# Patient Record
Sex: Male | Born: 1990 | Race: Black or African American | Hispanic: No | Marital: Single | Smoking: Never smoker
Health system: Southern US, Community
[De-identification: ages and names within clinical notes are randomized; demographics above are authoritative.]

## PROBLEM LIST (undated history)

## (undated) DIAGNOSIS — T7840XA Allergy, unspecified, initial encounter: Secondary | ICD-10-CM

## (undated) HISTORY — DX: Allergy, unspecified, initial encounter: T78.40XA

---

## 2010-12-30 ENCOUNTER — Emergency Department (HOSPITAL_COMMUNITY): Payer: Managed Care, Other (non HMO)

## 2010-12-30 ENCOUNTER — Emergency Department (HOSPITAL_COMMUNITY)
Admission: EM | Admit: 2010-12-30 | Discharge: 2010-12-31 | Disposition: A | Discharge status: Home / Self Care | Payer: Managed Care, Other (non HMO) | Attending: Emergency Medicine | Admitting: Emergency Medicine

## 2010-12-30 DIAGNOSIS — S01501A Unspecified open wound of lip, initial encounter: Secondary | ICD-10-CM | POA: Insufficient documentation

## 2010-12-30 DIAGNOSIS — IMO0002 Reserved for concepts with insufficient information to code with codable children: Secondary | ICD-10-CM | POA: Insufficient documentation

## 2010-12-30 DIAGNOSIS — S025XXA Fracture of tooth (traumatic), initial encounter for closed fracture: Secondary | ICD-10-CM | POA: Insufficient documentation

## 2012-03-18 IMAGING — CT CT MAXILLOFACIAL W/O CM
3 series · 17 of 47 positions shown, 20 images · non-contrast
Comparison: None

CLINICAL DATA: Fall, facial injury

CT MAXILLOFACIAL WITHOUT CONTRAST
TECHNIQUE: Multidetector CT imaging of the maxillofacial
structures was performed. Multiplanar CT image reconstructions were
also generated.

[Series 2: facial bones · axial · 0.36mm/px · z∈[+15,+167]mm · 11 of 90 slices shown, 14 images]
[im 7/90  brain]
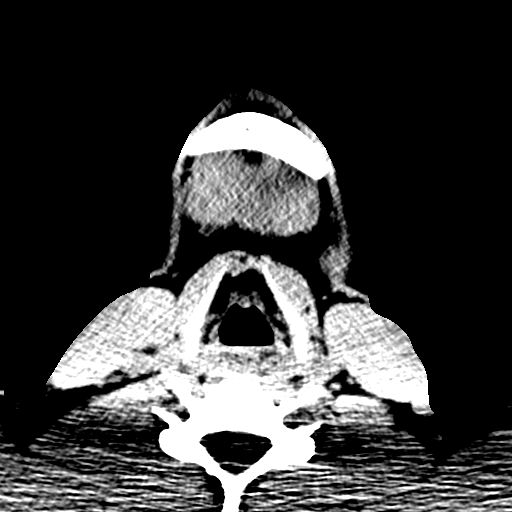
[im 7/90  bone]
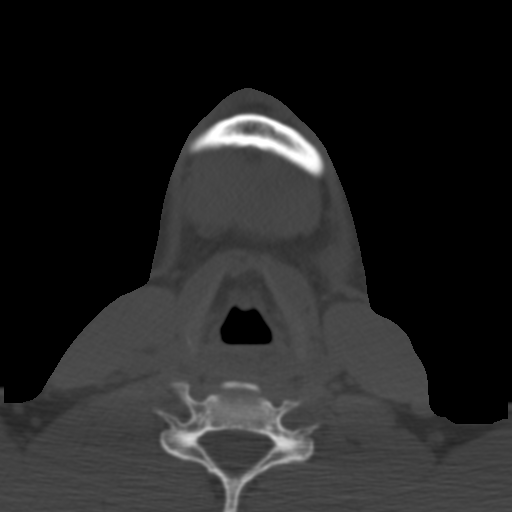
[im 13/90  bone]
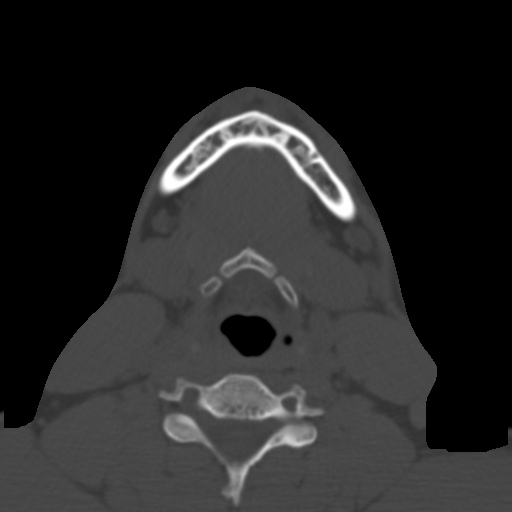
[im 22/90  bone]
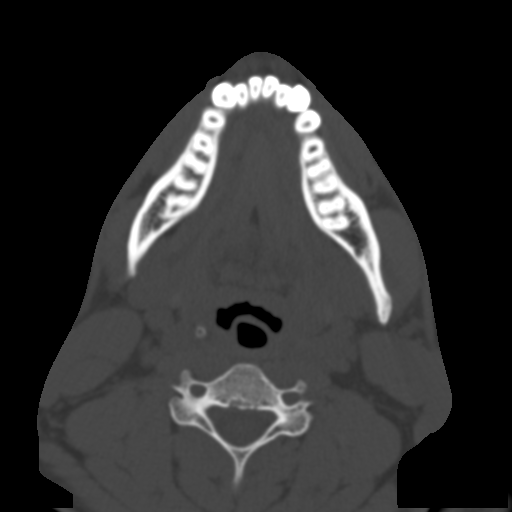
[im 28/90  bone]
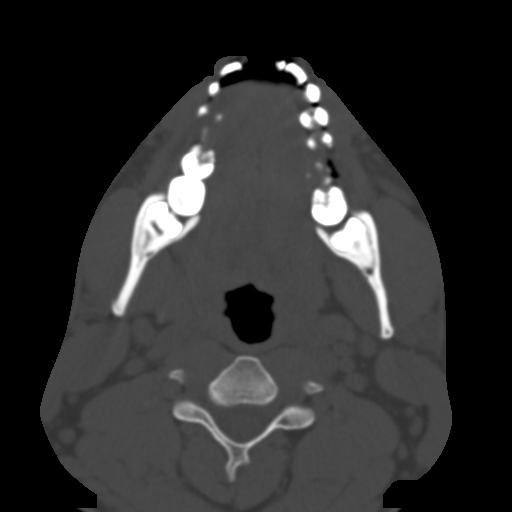
[im 37/90  brain]
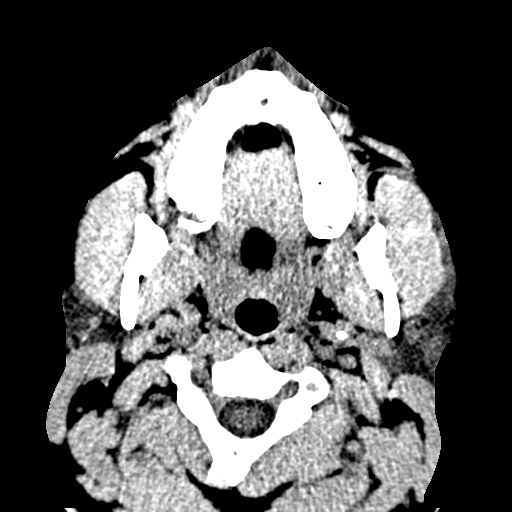
[im 37/90  bone]
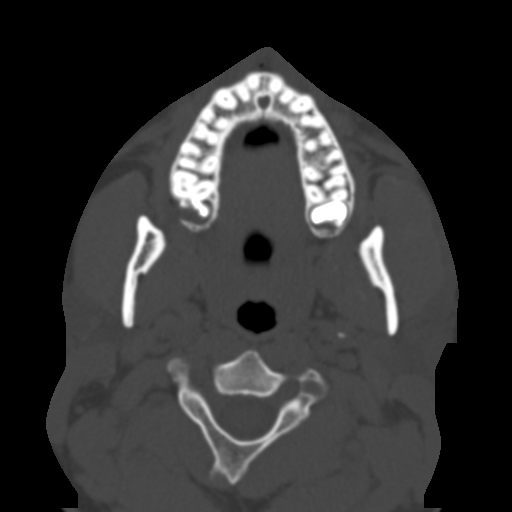
[im 47/90  bone]
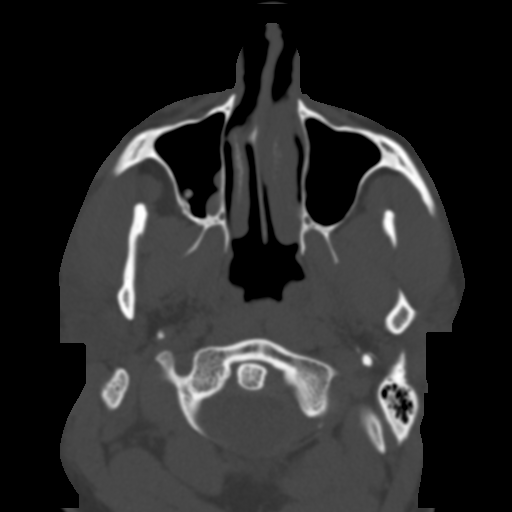
[im 53/90  bone]
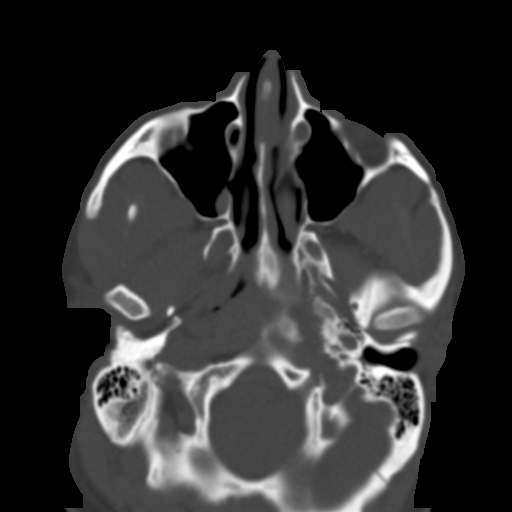
[im 62/90  bone]
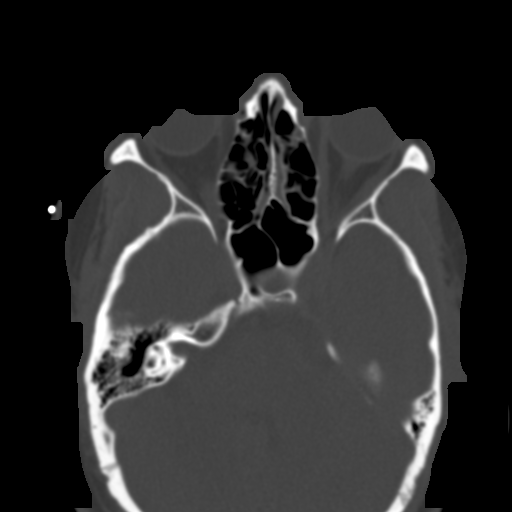
[im 68/90  brain]
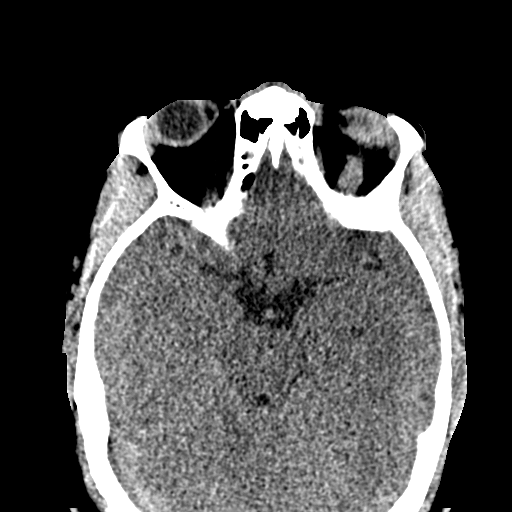
[im 68/90  bone]
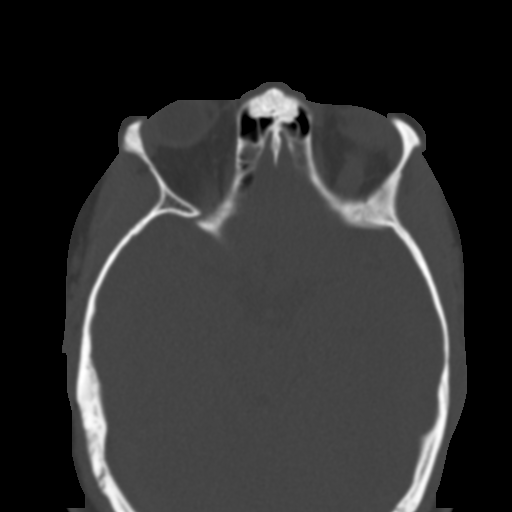
[im 77/90  bone]
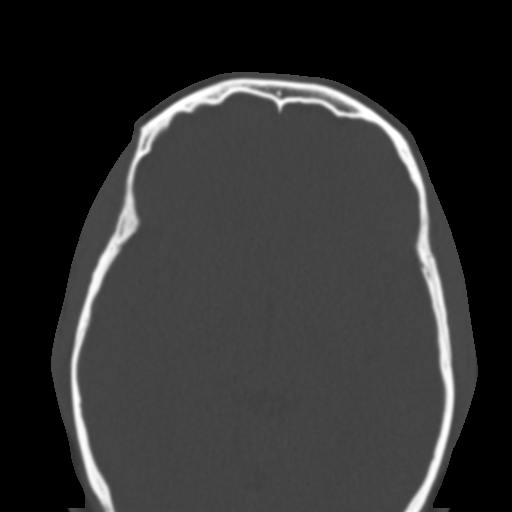
[im 83/90  bone]
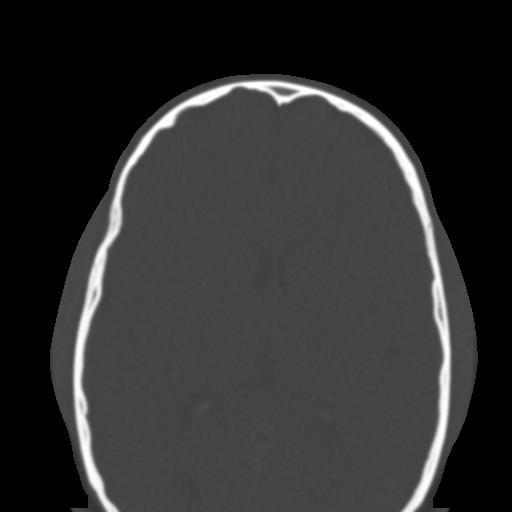

[cor · coronal · 0.36mm/px · 3 of 63 slices shown]
[im 21/63  bone]
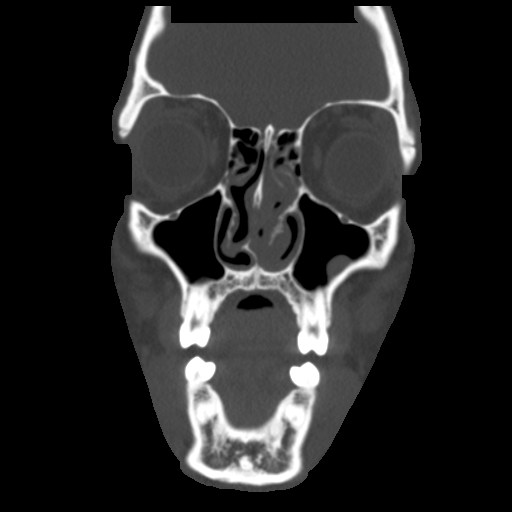
[im 28/63  bone]
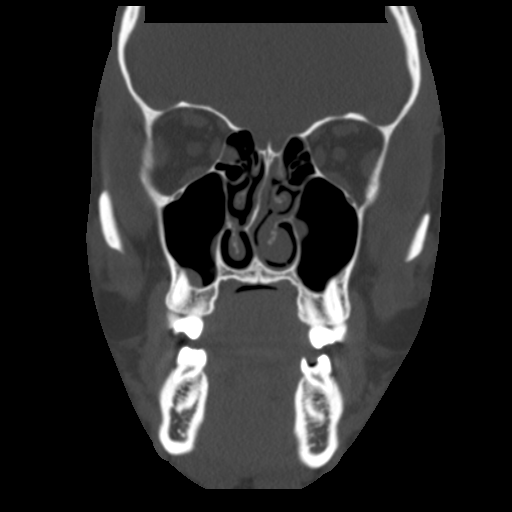
[im 35/63  bone]
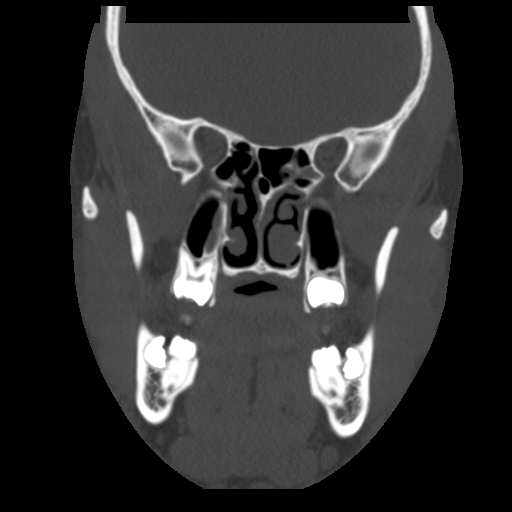

[sag · sagittal · 0.36mm/px · 3 of 71 slices shown]
[im 24/71  bone]
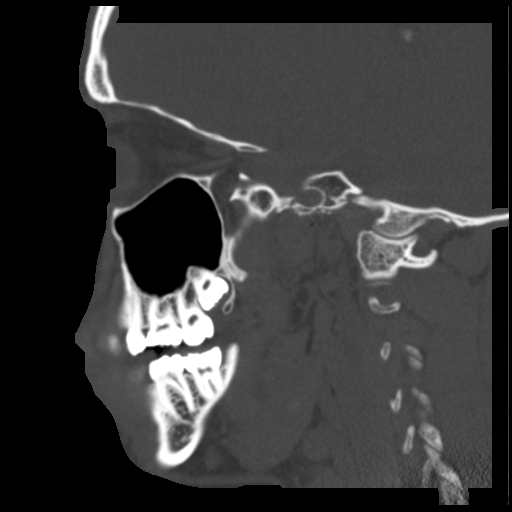
[im 36/71  bone]
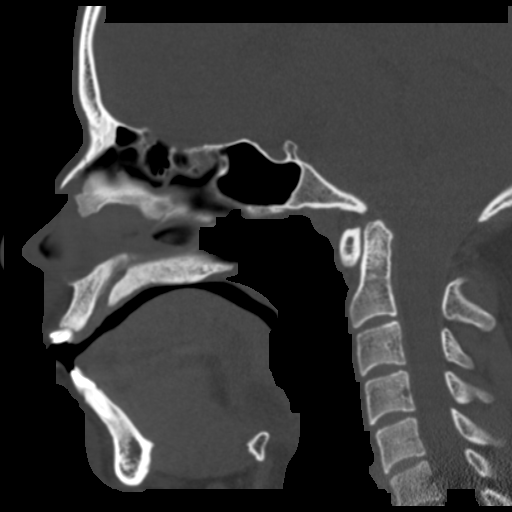
[im 47/71  bone]
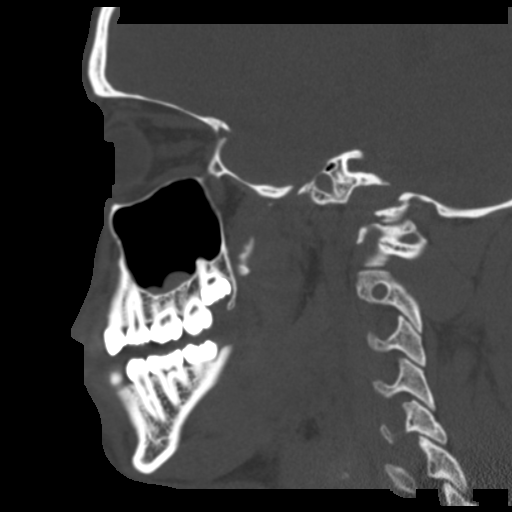

[17 of 47 positions shown; findings below may reference images not displayed]

FINDINGS: Negative for facial fracture.  Negative for mandible
fracture.  Mild chronic sinusitis.  No fluid in the sinuses.  No
mass or adenopathy is detected.
IMPRESSION: No acute abnormality.  Negative for facial fracture.

## 2013-12-03 ENCOUNTER — Ambulatory Visit (INDEPENDENT_AMBULATORY_CARE_PROVIDER_SITE_OTHER): Payer: Managed Care, Other (non HMO) | Admitting: Family Medicine

## 2013-12-03 VITALS — BP 116/74 | HR 67 | Temp 99.9°F | Resp 20 | Ht 69.5 in | Wt 193.0 lb

## 2013-12-03 DIAGNOSIS — J301 Allergic rhinitis due to pollen: Secondary | ICD-10-CM

## 2013-12-03 DIAGNOSIS — J309 Allergic rhinitis, unspecified: Secondary | ICD-10-CM

## 2013-12-03 DIAGNOSIS — J020 Streptococcal pharyngitis: Secondary | ICD-10-CM

## 2013-12-03 DIAGNOSIS — J029 Acute pharyngitis, unspecified: Secondary | ICD-10-CM

## 2013-12-03 LAB — POCT RAPID STREP A (OFFICE): RAPID STREP A SCREEN: POSITIVE — AB

## 2013-12-03 MED ORDER — AZELASTINE HCL 0.1 % NA SOLN
2.0000 | Freq: Two times a day (BID) | NASAL | Status: DC
Start: 2013-12-03 — End: 2013-12-14

## 2013-12-03 MED ORDER — AMOXICILLIN 500 MG PO TABS: 1000.0000 mg | ORAL_TABLET | Freq: Two times a day (BID) | ORAL | Status: DC

## 2013-12-03 MED ORDER — AMOXICILLIN 400 MG/5ML PO SUSR: 800.0000 mg | Freq: Three times a day (TID) | ORAL | Status: DC

## 2013-12-03 NOTE — Patient Instructions (Signed)
Strep Throat  Strep throat is an infection of the throat caused by a bacteria named Streptococcus pyogenes. Your caregiver may call the infection streptococcal "tonsillitis" or "pharyngitis" depending on whether there are signs of inflammation in the tonsils or back of the throat. Strep throat is most common in children aged 24 15 years during the cold months of the year, but it can occur in people of any age during any season. This infection is spread from person to person (contagious) through coughing, sneezing, or other close contact.  SYMPTOMS   · Fever or chills.  · Painful, swollen, red tonsils or throat.  · Pain or difficulty when swallowing.  · White or yellow spots on the tonsils or throat.  · Swollen, tender lymph nodes or "glands" of the neck or under the jaw.  · Red rash all over the body (rare).  DIAGNOSIS   Many different infections can cause the same symptoms. A test must be done to confirm the diagnosis so the right treatment can be given. A "rapid strep test" can help your caregiver make the diagnosis in a few minutes. If this test is not available, a light swab of the infected area can be used for a throat culture test. If a throat culture test is done, results are usually available in a day or two.  TREATMENT   Strep throat is treated with antibiotic medicine.  HOME CARE INSTRUCTIONS   · Gargle with 1 tsp of salt in 1 cup of warm water, 3 4 times per day or as needed for comfort.  · Family members who also have a sore throat or fever should be tested for strep throat and treated with antibiotics if they have the strep infection.  · Make sure everyone in your household washes their hands well.  · Do not share food, drinking cups, or personal items that could cause the infection to spread to others.  · You may need to eat a soft food diet until your sore throat gets better.  · Drink enough water and fluids to keep your urine clear or pale yellow. This will help prevent dehydration.  · Get plenty of  rest.  · Stay home from school, daycare, or work until you have been on antibiotics for 24 hours.  · Only take over-the-counter or prescription medicines for pain, discomfort, or fever as directed by your caregiver.  · If antibiotics are prescribed, take them as directed. Finish them even if you start to feel better.  SEEK MEDICAL CARE IF:   · The glands in your neck continue to enlarge.  · You develop a rash, cough, or earache.  · You cough up green, yellow-brown, or bloody sputum.  · You have pain or discomfort not controlled by medicines.  · Your problems seem to be getting worse rather than better.  SEEK IMMEDIATE MEDICAL CARE IF:   · You develop any new symptoms such as vomiting, severe headache, stiff or painful neck, chest pain, shortness of breath, or trouble swallowing.  · You develop severe throat pain, drooling, or changes in your voice.  · You develop swelling of the neck, or the skin on the neck becomes red and tender.  · You have a fever.  · You develop signs of dehydration, such as fatigue, dry mouth, and decreased urination.  · You become increasingly sleepy, or you cannot wake up completely.  Document Released: 06/29/2000 Document Revised: 06/18/2012 Document Reviewed: 08/31/2010  Exit[employer]® Patient Information ©2014 [employer].

## 2013-12-03 NOTE — Progress Notes (Addendum)
This chart was scribed for Ethelda ChickKristi M Ellanora Rayborn, MD by Tana ConchStephen Methvin, ED Scribe. This patient was seen in room 04 and the patient's care was started at 7:41 PM .  Subjective:    Patient ID: Anthony Curryyan O Lang, male    DOB: 1990/11/17, 23 y.o.   MRN: [id number]  HPI  HPI Comments: Anthony Lang is a 23 y.o. male with a h/o "bad allergies" who presents to [facility] complaining of a intermittent fever that has been present since Tuesday night. He reports associated wheezing, cough, ear pain, congestion, and sore throat. He denies vomiting, diarrhea, and nausea. He reports that NyQuil helps him sleep, but he does not take anything for his symptoms during the day. 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 He has allegra, but he does not take it everyday. He has not been around anyone with mono.   No PCP. [facility]  Past Medical History  Diagnosis Date  . Allergy     No current outpatient prescriptions on file prior to visit.   No current facility-administered medications on file prior to visit.    No Known Allergies   Review of Systems  Constitutional: Positive for fever and chills. Negative for activity change, appetite change and unexpected weight change.  HENT: Positive for congestion, ear pain, postnasal drip, rhinorrhea, sinus pressure and sore throat. Negative for tinnitus and trouble swallowing.   Eyes: Negative for pain, discharge, redness, itching and visual disturbance.  Respiratory: Positive for cough and wheezing. Negative for shortness of breath.   Cardiovascular: Negative for chest pain.  Allergic/Immunologic: Positive for environmental allergies.  Neurological: Negative for dizziness and headaches.       Objective:   Physical Exam  Nursing note and vitals reviewed. Constitutional: He is oriented to person, place, and time. He appears well-developed and well-nourished.  HENT:  Head: Normocephalic and atraumatic.  Mouth/Throat:  Oropharyngeal exudate and posterior oropharyngeal erythema present.  Eyes: Conjunctivae and EOM are normal. No scleral icterus.  Neck: Normal range of motion. Neck supple. No thyromegaly present.  Cardiovascular: Normal rate, regular rhythm, normal heart sounds and intact distal pulses.  Exam reveals no gallop and no friction rub.   No murmur heard. Pulmonary/Chest: Effort normal and breath sounds normal. No stridor. He has no wheezes. He has no rales. He exhibits no tenderness.  Abdominal: He exhibits no distension. There is no tenderness. There is no rebound.  Musculoskeletal: Normal range of motion. He exhibits no edema.  Lymphadenopathy:    He has cervical adenopathy.  Mild cervical adenopathy  Neurological: He is oriented to person, place, and time. He exhibits normal muscle tone. Coordination normal.  Skin: No rash noted. No erythema.  Psychiatric: He has a normal mood and affect. His behavior is normal.    Filed Vitals:   12/03/13 1920  BP: 116/74  Pulse: 67  Temp: 99.9 F (37.7 C)  Resp: 20    Results for orders placed in visit on 12/03/13  POCT RAPID STREP A (OFFICE)      Result Value Ref Range   Rapid Strep A Screen Positive (*) Negative        Assessment & Plan:  Acute pharyngitis - Plan: POCT rapid strep A  Streptococcal sore throat  Allergic rhinitis due to pollen   1. Strep Pharyngitis: New. Rx for Amoxicillin provided.   Recommend supportive care with rest, fluids, gargles, Ibuprfoen or Tylenol PRN.  RTC inability to swallow. 2.  Allergic Rhinitis: uncontrolled; rx for Astelin nasal spray provided; increase Allegra to daily use.   Meds ordered this encounter  Medications  . fexofenadine (ALLEGRA) 180 MG tablet    Sig: Take 180 mg by mouth daily.  Marland Kitchen. DISCONTD: amoxicillin (AMOXIL) 500 MG tablet    Sig: Take 2 tablets (1,000 mg total) by mouth 2 (two) times daily.    Dispense:  40 tablet    Refill:  0  . amoxicillin (AMOXIL) 400 MG/5ML suspension     Sig: Take 10 mLs (800 mg total) by mouth 3 (three) times daily.    Dispense:  300 mL    Refill:  0    PLEASE DELETE PRESCRIPTION FOR TABLETS OF AMOXICILLIN  . azelastine (ASTELIN) 0.1 % nasal spray    Sig: Place 2 sprays into both nostrils 2 (two) times daily. Use in each nostril as directed    Dispense:  30 mL    Refill:  12     I personally performed the services described in this documentation, which was scribed in my presence. The recorded information has been reviewed and is accurate.  Nilda SimmerKristi Vivyan Biggers, M.D.  [facility]  [place] [address] [place], [place]  [address] [phone] phone [phone] fax

## 2013-12-14 ENCOUNTER — Ambulatory Visit: Payer: Self-pay | Admitting: Emergency Medicine

## 2013-12-14 VITALS — BP 125/80 | HR 81 | Temp 97.7°F | Resp 16 | Ht 69.25 in | Wt 196.4 lb

## 2013-12-14 DIAGNOSIS — Z0289 Encounter for other administrative examinations: Secondary | ICD-10-CM

## 2013-12-14 NOTE — Progress Notes (Signed)
[facility] [address] [place] [address] [phone]  Date:  12/14/2013   Name:  Anthony Lang   DOB:  06-22-1991   MRN:  [id number]  PCP:  No primary provider on file.    Chief Complaint: DOT PE   History of Present Illness:  Anthony Lang is a 23 y.o. very pleasant male patient who presents with the following:  DOT cert   There are no active problems to display for this patient.   Past Medical History  Diagnosis Date  . Allergy     No past surgical history on file.  History  Substance Use Topics  . Smoking status: Never Smoker   . Smokeless tobacco: Not on file  . Alcohol Use: No    Family History  Problem Relation Age of Onset  . Hyperlipidemia Mother   . Hypertension Father   . Hypertension Maternal Grandmother   . Hypertension Paternal Grandmother     No Known Allergies  Medication list has been reviewed and updated.  Current Outpatient Prescriptions on File Prior to Visit  Medication Sig Dispense Refill  . fexofenadine (ALLEGRA) 180 MG tablet Take 180 mg by mouth daily.       No current facility-administered medications on file prior to visit.    Review of Systems:  As per HPI, otherwise negative.    Physical Examination: Filed Vitals:   12/14/13 1333  BP: 125/80  Pulse: 81  Temp: 97.7 F (36.5 C)  Resp: 16   Filed Vitals:   12/14/13 1333  Height: 5' 9.25" (1.759 m)  Weight: 196 lb 6.4 oz (89.086 kg)   Body mass index is 28.79 kg/(m^2). Ideal Body Weight: Weight in (lb) to have BMI = 25: 170.2  GEN: WDWN, NAD, Non-toxic, A & O x 3 HEENT: Atraumatic, Normocephalic. Neck supple. No masses, No LAD. Ears and Nose: No external deformity. CV: RRR, No M/G/R. No JVD. No thrill. No extra heart sounds. PULM: CTA B, no wheezes, crackles, rhonchi. No retractions. No resp. distress. No accessory muscle use. ABD: S, NT, ND, +BS. No rebound. No HSM. EXTR: No c/c/e NEURO Normal gait.  PSYCH: Normally  interactive. Conversant. Not depressed or anxious appearing.  Calm demeanor.    Assessment and Plan: DOT cert Signed,  Phillips Odor, MD

## 2014-01-06 ENCOUNTER — Ambulatory Visit (INDEPENDENT_AMBULATORY_CARE_PROVIDER_SITE_OTHER): Payer: Managed Care, Other (non HMO) | Admitting: Family Medicine

## 2014-01-06 VITALS — BP 118/82 | HR 70 | Temp 98.2°F | Resp 18 | Ht 69.25 in | Wt 197.6 lb

## 2014-01-06 DIAGNOSIS — H6981 Other specified disorders of Eustachian tube, right ear: Secondary | ICD-10-CM

## 2014-01-06 DIAGNOSIS — H6980 Other specified disorders of Eustachian tube, unspecified ear: Secondary | ICD-10-CM

## 2014-01-06 MED ORDER — FLUTICASONE PROPIONATE 50 MCG/ACT NA SUSP: 2.0000 | Freq: Every day | NASAL | Status: AC

## 2014-01-06 NOTE — Patient Instructions (Signed)
You have fluid behind your right ear drum- this is likely due to nasal congestion.  Use the flonase nasal spray, and you might also try an OTC decongestant such as sudafed.  However, wait and start these tomorrow so your eye dilation can wear off.  Let me know if you are not better soon!

## 2014-01-06 NOTE — Progress Notes (Signed)
[facility] [address], [place] [address] [phone]  Date:  01/06/2014   Name:  Anthony CurryRyan O Lang   DOB:  [birth year]   MRN:  [id number]  PCP:  No PCP Per Patient    Chief Complaint: Ear Fullness   History of Present Illness:  Anthony Lang is a 23 y.o. very pleasant male patient who presents with the following:  He notes a clogged up feeling in his ears- last week it was the left, and now the right.  No pain.  He can hear fairly normally.  He has noted some nasal congestion and cough recently.  No fever. He is generally in very good health Never a smoker No ST  He has tried some mucinex for congestion, no other meds so far Just came from the eye doctor where his eyes were dilated There are no active problems to display for this patient.   Past Medical History  Diagnosis Date  . Allergy     History reviewed. No pertinent past surgical history.  History  Substance Use Topics  . Smoking status: Never Smoker   . Smokeless tobacco: Not on file  . Alcohol Use: No    Family History  Problem Relation Age of Onset  . Hyperlipidemia Mother   . Hypertension Father   . Hypertension Maternal Grandmother   . Hypertension Paternal Grandmother     No Known Allergies  Medication list has been reviewed and updated.  Current Outpatient Prescriptions on File Prior to Visit  Medication Sig Dispense Refill  . fexofenadine (ALLEGRA) 180 MG tablet Take 180 mg by mouth daily.       No current facility-administered medications on file prior to visit.    Review of Systems:  As per HPI- otherwise negative.   Physical Examination: Filed Vitals:   01/06/14 1642  BP: 118/82  Pulse: 70  Temp: 98.2 F (36.8 C)  Resp: 18   Filed Vitals:   01/06/14 1642  Height: 5' 9.25" (1.759 m)  Weight: 197 lb 9.6 oz (89.631 kg)   Body mass index is 28.97 kg/(m^2). Ideal Body Weight: Weight in (lb) to have BMI = 25: 170.2  GEN: WDWN, NAD, Non-toxic, A & O  x 3 HEENT: Atraumatic, Normocephalic. Neck supple. No masses, No LAD. Ears and Nose: No external deformity. CV: RRR, No M/G/R. No JVD. No thrill. No extra heart sounds. PULM: CTA B, no wheezes, crackles, rhonchi. No retractions. No resp. distress. No accessory muscle use. EXTR: No c/c/e NEURO Normal gait.  PSYCH: Normally interactive. Conversant. Not depressed or anxious appearing.  Calm demeanor.  A right middle ear effusion is present.  Otherwise both ears are normal, no wax, no OE, no sign of OM.  Oropharynx wnl.  Pupils dilated bilaterally due to dilating drops. Some nasal cavity congestion   Assessment and Plan: ETD (eustachian tube dysfunction), right - Plan: fluticasone (FLONASE) 50 MCG/ACT nasal spray  Treat for ETD/ effusion with flonase and sudafed.  He declines prednisone for now. If not better he will let me know and I will call in prednisone if he likes   Signed Abbe AmsterdamJessica Copland, MD

## 2014-01-09 ENCOUNTER — Telehealth: Payer: Self-pay

## 2014-01-09 MED ORDER — PREDNISONE 20 MG PO TABS: ORAL_TABLET | ORAL | Status: AC

## 2014-01-09 NOTE — Telephone Encounter (Signed)
Pt.notified

## 2014-01-09 NOTE — Telephone Encounter (Signed)
Prednisone sent per Dr. Cyndie Chimeopland's OV note

## 2014-01-09 NOTE — Telephone Encounter (Signed)
Pt was seen 6/24 by Dr Patsy Lageropland and she told him if he wasn't better that he could call in and get a script called in for a steroid. He uses [facility] on [address]. He can be reached at [phone]. Thank you

## 2015-12-13 ENCOUNTER — Ambulatory Visit (INDEPENDENT_AMBULATORY_CARE_PROVIDER_SITE_OTHER): Payer: Self-pay | Admitting: Family Medicine

## 2015-12-13 VITALS — BP 128/88 | HR 71 | Temp 98.1°F | Resp 16 | Ht 69.25 in | Wt 226.0 lb

## 2015-12-13 DIAGNOSIS — Z024 Encounter for examination for driving license: Secondary | ICD-10-CM

## 2015-12-13 DIAGNOSIS — Z021 Encounter for pre-employment examination: Secondary | ICD-10-CM

## 2015-12-13 NOTE — Patient Instructions (Signed)
     IF you received an x-ray today, you will receive an invoice from [facility]. Please contact [facility] at [phone] with questions or concerns regarding your invoice.   IF you received labwork today, you will receive an invoice from [facility]. Please contact [facility] at [phone] with questions or concerns regarding your invoice.   Our billing staff will not be able to assist you with questions regarding bills from these companies.  You will be contacted with the lab results as soon as they are available. The fastest way to get your results is to activate your My Chart account. Instructions are located on the last page of this paperwork. If you have not heard from us regarding the results in 2 weeks, please contact this office.      

## 2015-12-13 NOTE — Progress Notes (Signed)
Commercial Driver Medical Examination   Anthony CurryRyan O Vine is a 25 y.o. male who presents today for a commercial driver fitness determination physical exam. The patient reports no problems. The following portions of the patient's history were reviewed and updated as appropriate: allergies, current medications, past family history, past medical history, past social history, past surgical history and problem list. Review of Systems A comprehensive review of systems was negative.   Objective:    Vision:  Visual Acuity Screening   Right eye Left eye Both eyes  Without correction:     With correction: 20/25 20/25 20/25   Comments: Peripheral Vision: Right eye 85 degrees. Left eye 70 degrees.  The patient can distinguish the colors red, amber and green.  Applicant can recognize and distinguish among traffic control signals and devices showing standard red, green, and amber colors.  Applicant meets visual acuity requirement only when wearing corrective lenses.  Monocular Vision?: No   Hearing:  Hearing Screening Comments: The patient was able to hear a forced whisper from 10 feet.  BP 128/88 mmHg  Pulse 71  Temp(Src) 98.1 F (36.7 C) (Oral)  Resp 16  Ht 5' 9.25" (1.759 m)  Wt 226 lb (102.513 kg)  BMI 33.13 kg/m2  SpO2 99%  General Appearance:    Alert, cooperative, no distress, appears stated age  Head:    Normocephalic, without obvious abnormality, atraumatic  Eyes:    PERRL, conjunctiva/corneas clear, EOM's intact, fundi    benign, both eyes       Ears:    Normal TM's and external ear canals, both ears  Nose:   Nares normal, septum midline, mucosa normal, no drainage    or sinus tenderness  Throat:   Lips, mucosa, and tongue normal; teeth and gums normal  Neck:   Supple, symmetrical, trachea midline, no adenopathy;       thyroid:  No enlargement/tenderness/nodules; no carotid   bruit or JVD  Back:     Symmetric, no curvature, ROM normal, no CVA tenderness  Lungs:     Clear to  auscultation bilaterally, respirations unlabored  Chest wall:    No tenderness or deformity  Heart:    Regular rate and rhythm, S1 and S2 normal, no murmur, rub   or gallop  Abdomen:     Soft, non-tender, bowel sounds active all four quadrants,    no masses, no organomegaly        Extremities:   Extremities normal, atraumatic, no cyanosis or edema  Pulses:   2+ and symmetric all extremities  Skin:   Skin color, texture, turgor normal, no rashes or lesions  Lymph nodes:   Cervical, supraclavicular, and axillary nodes normal  Neurologic:   CNII-XII intact. Normal strength, sensation and reflexes      throughout    Labs: UA 1.025, prot 30, neg blood, neg sugar  Assessment:    Healthy male exam.  Meets standards in 5349 CFR 391.41;  qualifies for 2 year certificate.    Plan:    Medical examiners certificate completed and printed. Return as needed.   Patient ID: Anthony CurryRyan O Lang, male   DOB: February 14, 1991, 25 y.o.   MRN: [id number]
